# Patient Record
Sex: Male | Born: 1965 | Race: White | Hispanic: No | Marital: Single | Smoking: Current every day smoker
Health system: Southern US, Community
[De-identification: ages and names within clinical notes are randomized; demographics above are authoritative.]

## PROBLEM LIST (undated history)

## (undated) DIAGNOSIS — E785 Hyperlipidemia, unspecified: Secondary | ICD-10-CM

## (undated) DIAGNOSIS — L98499 Non-pressure chronic ulcer of skin of other sites with unspecified severity: Secondary | ICD-10-CM

## (undated) DIAGNOSIS — I10 Essential (primary) hypertension: Secondary | ICD-10-CM

## (undated) HISTORY — PX: TIBIA FRACTURE SURGERY: SHX806

## (undated) HISTORY — DX: Essential (primary) hypertension: I10

## (undated) HISTORY — DX: Non-pressure chronic ulcer of skin of other sites with unspecified severity: L98.499

## (undated) HISTORY — PX: BLADDER SURGERY: SHX569

## (undated) HISTORY — PX: CHOLECYSTECTOMY: SHX55

---

## 2009-04-03 ENCOUNTER — Ambulatory Visit (HOSPITAL_COMMUNITY): Payer: Self-pay | Admitting: Psychiatry

## 2009-04-17 ENCOUNTER — Ambulatory Visit (HOSPITAL_COMMUNITY): Payer: Self-pay | Admitting: Psychiatry

## 2009-05-13 ENCOUNTER — Ambulatory Visit (HOSPITAL_COMMUNITY): Payer: Self-pay | Admitting: Psychiatry

## 2009-05-28 ENCOUNTER — Ambulatory Visit (HOSPITAL_COMMUNITY): Payer: Self-pay | Admitting: Psychiatry

## 2009-06-11 ENCOUNTER — Ambulatory Visit (HOSPITAL_COMMUNITY): Payer: Self-pay | Admitting: Psychiatry

## 2009-06-25 ENCOUNTER — Ambulatory Visit (HOSPITAL_COMMUNITY): Payer: Self-pay | Admitting: Psychiatry

## 2009-07-09 ENCOUNTER — Ambulatory Visit (HOSPITAL_COMMUNITY): Payer: Self-pay | Admitting: Psychiatry

## 2009-07-22 ENCOUNTER — Ambulatory Visit (HOSPITAL_COMMUNITY): Payer: Self-pay | Admitting: Psychiatry

## 2009-08-05 ENCOUNTER — Ambulatory Visit (HOSPITAL_COMMUNITY): Payer: Self-pay | Admitting: Psychiatry

## 2009-09-01 ENCOUNTER — Ambulatory Visit (HOSPITAL_COMMUNITY): Payer: Self-pay | Admitting: Psychiatry

## 2009-09-23 ENCOUNTER — Ambulatory Visit (HOSPITAL_COMMUNITY): Payer: Self-pay | Admitting: Psychiatry

## 2009-10-07 ENCOUNTER — Ambulatory Visit (HOSPITAL_COMMUNITY): Payer: Self-pay | Admitting: Psychiatry

## 2009-10-24 ENCOUNTER — Ambulatory Visit (HOSPITAL_COMMUNITY): Payer: Self-pay | Admitting: Psychiatry

## 2009-11-26 ENCOUNTER — Ambulatory Visit (HOSPITAL_COMMUNITY): Payer: Self-pay | Admitting: Licensed Clinical Social Worker

## 2009-12-17 ENCOUNTER — Ambulatory Visit (HOSPITAL_COMMUNITY): Payer: Self-pay | Admitting: Licensed Clinical Social Worker

## 2010-01-12 ENCOUNTER — Ambulatory Visit (HOSPITAL_COMMUNITY): Payer: Self-pay | Admitting: Licensed Clinical Social Worker

## 2010-01-24 ENCOUNTER — Ambulatory Visit: Payer: Self-pay | Admitting: Family Medicine

## 2010-01-24 DIAGNOSIS — M79609 Pain in unspecified limb: Secondary | ICD-10-CM

## 2010-01-24 DIAGNOSIS — E785 Hyperlipidemia, unspecified: Secondary | ICD-10-CM

## 2010-01-24 DIAGNOSIS — L02419 Cutaneous abscess of limb, unspecified: Secondary | ICD-10-CM

## 2010-01-24 DIAGNOSIS — L03119 Cellulitis of unspecified part of limb: Secondary | ICD-10-CM

## 2010-01-25 ENCOUNTER — Encounter: Payer: Self-pay | Admitting: Family Medicine

## 2010-02-05 ENCOUNTER — Encounter: Payer: Self-pay | Admitting: Family Medicine

## 2010-02-06 ENCOUNTER — Ambulatory Visit: Payer: Self-pay | Admitting: Family Medicine

## 2010-02-06 DIAGNOSIS — M546 Pain in thoracic spine: Secondary | ICD-10-CM

## 2010-02-06 DIAGNOSIS — S139XXA Sprain of joints and ligaments of unspecified parts of neck, initial encounter: Secondary | ICD-10-CM

## 2010-02-09 ENCOUNTER — Encounter: Payer: Self-pay | Admitting: Family Medicine

## 2010-02-12 ENCOUNTER — Ambulatory Visit (HOSPITAL_COMMUNITY): Payer: Self-pay | Admitting: Psychiatry

## 2010-02-18 ENCOUNTER — Ambulatory Visit (HOSPITAL_COMMUNITY): Admission: RE | Admit: 2010-02-18 | Discharge: 2010-02-18 | Payer: Self-pay | Admitting: Sports Medicine

## 2010-03-16 ENCOUNTER — Ambulatory Visit (HOSPITAL_COMMUNITY): Payer: Self-pay | Admitting: Licensed Clinical Social Worker

## 2010-05-14 ENCOUNTER — Ambulatory Visit (HOSPITAL_COMMUNITY): Payer: Self-pay | Admitting: Licensed Clinical Social Worker

## 2010-06-08 ENCOUNTER — Ambulatory Visit (HOSPITAL_COMMUNITY): Payer: Self-pay | Admitting: Licensed Clinical Social Worker

## 2010-07-14 ENCOUNTER — Ambulatory Visit (HOSPITAL_COMMUNITY): Payer: Self-pay | Admitting: Licensed Clinical Social Worker

## 2010-08-12 ENCOUNTER — Ambulatory Visit (HOSPITAL_COMMUNITY): Payer: Self-pay | Admitting: Licensed Clinical Social Worker

## 2010-09-08 ENCOUNTER — Ambulatory Visit (HOSPITAL_COMMUNITY): Payer: Self-pay | Admitting: Licensed Clinical Social Worker
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
## 2010-10-20 NOTE — Letter (Signed)
Summary: Internal Correspondence  Internal Correspondence   Imported By: Dannette Barbara 02/09/2010 09:32:48  _____________________________________________________________________  External Attachment:    Type:   Image     Comment:   External Document
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
## 2010-10-30 ENCOUNTER — Encounter (INDEPENDENT_AMBULATORY_CARE_PROVIDER_SITE_OTHER): Payer: Medicare Other | Admitting: Licensed Clinical Social Worker

## 2010-10-30 DIAGNOSIS — F329 Major depressive disorder, single episode, unspecified: Secondary | ICD-10-CM

## 2010-11-24 ENCOUNTER — Encounter (INDEPENDENT_AMBULATORY_CARE_PROVIDER_SITE_OTHER): Payer: Medicare Other | Admitting: Licensed Clinical Social Worker

## 2010-11-24 DIAGNOSIS — F329 Major depressive disorder, single episode, unspecified: Secondary | ICD-10-CM

## 2010-12-24 ENCOUNTER — Encounter (INDEPENDENT_AMBULATORY_CARE_PROVIDER_SITE_OTHER): Payer: Medicare Other | Admitting: Licensed Clinical Social Worker

## 2010-12-24 DIAGNOSIS — F329 Major depressive disorder, single episode, unspecified: Secondary | ICD-10-CM

## 2011-01-26 ENCOUNTER — Encounter (INDEPENDENT_AMBULATORY_CARE_PROVIDER_SITE_OTHER): Payer: Medicare Other | Admitting: Licensed Clinical Social Worker

## 2011-01-26 DIAGNOSIS — F329 Major depressive disorder, single episode, unspecified: Secondary | ICD-10-CM

## 2011-02-24 ENCOUNTER — Encounter (HOSPITAL_COMMUNITY): Payer: Medicare Other | Admitting: Licensed Clinical Social Worker

## 2011-03-02 ENCOUNTER — Encounter (INDEPENDENT_AMBULATORY_CARE_PROVIDER_SITE_OTHER): Payer: Medicare Other | Admitting: Licensed Clinical Social Worker

## 2011-03-02 DIAGNOSIS — F4323 Adjustment disorder with mixed anxiety and depressed mood: Secondary | ICD-10-CM

## 2011-04-01 ENCOUNTER — Encounter (INDEPENDENT_AMBULATORY_CARE_PROVIDER_SITE_OTHER): Payer: Medicare Other | Admitting: Licensed Clinical Social Worker

## 2011-04-01 DIAGNOSIS — F329 Major depressive disorder, single episode, unspecified: Secondary | ICD-10-CM

## 2011-05-05 ENCOUNTER — Encounter (INDEPENDENT_AMBULATORY_CARE_PROVIDER_SITE_OTHER): Payer: Medicare Other | Admitting: Licensed Clinical Social Worker

## 2011-05-05 DIAGNOSIS — F341 Dysthymic disorder: Secondary | ICD-10-CM

## 2011-07-06 ENCOUNTER — Encounter (INDEPENDENT_AMBULATORY_CARE_PROVIDER_SITE_OTHER): Payer: Medicare Other | Admitting: Licensed Clinical Social Worker

## 2011-07-06 DIAGNOSIS — F4323 Adjustment disorder with mixed anxiety and depressed mood: Secondary | ICD-10-CM

## 2011-08-05 ENCOUNTER — Ambulatory Visit (INDEPENDENT_AMBULATORY_CARE_PROVIDER_SITE_OTHER): Payer: Medicaid Other | Admitting: Licensed Clinical Social Worker

## 2011-08-05 ENCOUNTER — Encounter (HOSPITAL_COMMUNITY): Payer: Self-pay | Admitting: Licensed Clinical Social Worker

## 2011-08-05 DIAGNOSIS — F329 Major depressive disorder, single episode, unspecified: Secondary | ICD-10-CM

## 2011-08-05 NOTE — Patient Instructions (Addendum)
Work on Environmental consultant low - decrease stress to keep BP lower Continue contacts with biological family

## 2011-08-06 ENCOUNTER — Encounter (HOSPITAL_COMMUNITY): Payer: Self-pay | Admitting: Licensed Clinical Social Worker

## 2011-08-06 DIAGNOSIS — F329 Major depressive disorder, single episode, unspecified: Secondary | ICD-10-CM | POA: Insufficient documentation
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
## 2011-10-26 ENCOUNTER — Ambulatory Visit (INDEPENDENT_AMBULATORY_CARE_PROVIDER_SITE_OTHER): Payer: Medicare Other | Admitting: Licensed Clinical Social Worker

## 2011-10-26 DIAGNOSIS — F329 Major depressive disorder, single episode, unspecified: Secondary | ICD-10-CM

## 2011-10-26 NOTE — Patient Instructions (Signed)
Bring complete drug list next time Cautioned about over helping his brother who is in financial trouble

## 2011-10-26 NOTE — Progress Notes (Signed)
Patient ID: Joshua Cantrell, male   DOB: 12/03/1965, 46 y.o.   MRN: [id number]   Joshua Cantrell was in good spirits today - he feels he is doing well and feels good that Eber Jones is responding to his suggestions about her health and use of medications. He has a fair bit of knowledge and is good with her and consistent.  There is concern now for Joshua Cantrell because his brother is in trouble financially which is no surprise - he has taken a loan of the free house and his car and has no way of paying all of it back.  Now he is asking for his brother and sister's help.  He is going to help him go to consumer credit and follow up with him on his agreements.  If he becomes homeless Joshua Cantrell is going to have trouble saying no.  His brother has no idea how to take care of things because he has never been required to take care of himself. His marriage will probably be over once all the money is gone   It probably went to take care of his wife's family.  Joshua Cantrell and his sisters have agreed they are not going to help him financially.  His biological mother is now living in her own apt and family comes to visit her.  She is pleased because she has wanted her own space.  He has been working with Joshua Cantrell and she has been more alert, active and involved.  Even talking about doing some volunteering  She does well with older people and there are so many older people who have been forgotten and would like a visitor.  She is considering that.  Joshua Cantrell has helped her get her stomach and bowels working better She is drinking a lot of water and only 2 cokes a day.  Her blood sugar is tending to go higher once in awhile but she is still on the Metformin and another drug to manage her blood sugar - no insulin yet.  Joshua Cantrell would like to see her come down more on the Xanax but that is probably a losing battle. He has decided to get a cat instead of a dog because Joshua Cantrell will not walk the dog so a cat will be less care taking.  He wants a specific kind of cat that has  its roots in [place] - striped gray. Not a kitten an older car who is more settled.  He studies things on the computer to get information - He seems to enjoy learning and knowledge.

## 2011-12-21 ENCOUNTER — Ambulatory Visit (INDEPENDENT_AMBULATORY_CARE_PROVIDER_SITE_OTHER): Payer: Medicare Other | Admitting: Licensed Clinical Social Worker

## 2011-12-21 DIAGNOSIS — F341 Dysthymic disorder: Secondary | ICD-10-CM
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
## 2012-02-22 ENCOUNTER — Ambulatory Visit (HOSPITAL_COMMUNITY): Payer: Self-pay | Admitting: Licensed Clinical Social Worker

## 2012-03-07 ENCOUNTER — Ambulatory Visit (HOSPITAL_COMMUNITY): Payer: Medicare Other | Admitting: Licensed Clinical Social Worker

## 2012-03-07 NOTE — Progress Notes (Unsigned)
   THERAPIST PROGRESS NOTE  Session Time: 2:10 -3:00  Participation Level: Active  Behavioral Response: CasualAlertEuthymic  Type of Therapy: Individual Therapy  Treatment Goals addressed: Coping  Interventions: Motivational Interviewing and Supportive  Summary: Joshua Cantrell is a 46 y.o. male who presents with Lor    Suicidal/Homicidal: No  Plan: Return again in *** weeks.  Diagnosis: Axis I: Depressive Disorder NOS    Axis II: Deferred    Mcclain Shall,[name], LCSW 03/07/2012

## 2015-02-05 ENCOUNTER — Emergency Department (INDEPENDENT_AMBULATORY_CARE_PROVIDER_SITE_OTHER)
Admission: EM | Admit: 2015-02-05 | Discharge: 2015-02-05 | Disposition: A | Discharge status: Home / Self Care | Payer: Medicare Other | Source: Home / Self Care | Attending: Family Medicine | Admitting: Family Medicine

## 2015-02-05 ENCOUNTER — Encounter: Payer: Self-pay | Admitting: *Deleted

## 2015-02-05 DIAGNOSIS — K047 Periapical abscess without sinus: Secondary | ICD-10-CM

## 2015-02-05 DIAGNOSIS — K088 Other specified disorders of teeth and supporting structures: Secondary | ICD-10-CM | POA: Diagnosis not present

## 2015-02-05 DIAGNOSIS — K0889 Other specified disorders of teeth and supporting structures: Secondary | ICD-10-CM

## 2015-02-05 HISTORY — DX: Hyperlipidemia, unspecified: E78.5

## 2015-02-05 LAB — POCT CBC W AUTO DIFF (K'VILLE URGENT CARE)

## 2015-02-05 MED ORDER — CLINDAMYCIN HCL 300 MG PO CAPS: 300.0000 mg | ORAL_CAPSULE | Freq: Three times a day (TID) | ORAL | Status: DC

## 2015-02-05 MED ORDER — KETOROLAC TROMETHAMINE 60 MG/2ML IM SOLN
60.0000 mg | Freq: Once | INTRAMUSCULAR | Status: AC
  Administered 2015-02-05: 60 mg via INTRAMUSCULAR

## 2015-02-05 MED ORDER — CEFTRIAXONE SODIUM 1 G IJ SOLR
1.0000 g | Freq: Once | INTRAMUSCULAR | Status: AC
  Administered 2015-02-05: 1 g via INTRAMUSCULAR

## 2015-02-05 NOTE — ED Notes (Addendum)
Pt c/o LT front of his mouth tooth abscess x 1 day. He has taken IBF and hydrocodone without relief. Denies fever. Took IBF at 0300.

## 2015-02-05 NOTE — ED Provider Notes (Signed)
CSN: [id number]     Arrival date & time 02/05/15  [id number] History   First MD Initiated Contact with Patient 02/05/15 0914     Chief Complaint  Patient presents with  . Dental Pain      HPI Comments: Patient complains of recurrent left upper toothache yesterday.  This morning he had chills/sweats and left facial pain.  Two months ago he had a similar toothache and was treated with an antibiotic by his dentist, who recommended that he return for a root canal (patient forgot about rescheduling an appointment)  Patient is a 49 y.o. male presenting with tooth pain. The history is provided by the patient.  Dental Pain Location:  Upper Upper teeth location:  11/LU cuspid Quality:  Dull Onset quality:  Gradual Duration:  2 days Timing:  Constant Progression:  Worsening Chronicity:  Recurrent Context: poor dentition   Previous work-up:  Dental exam Relieved by:  Nothing Worsened by:  Hot food/drink and touching Ineffective treatments:  None tried Associated symptoms: facial pain, fever and gum swelling   Associated symptoms: no congestion, no difficulty swallowing, no drooling, no neck pain, no neck swelling, no oral bleeding, no oral lesions and no trismus     Past Medical History  Diagnosis Date  . Hypertension   . Ulcer of abdomen wall   . Hyperlipidemia    Past Surgical History  Procedure Laterality Date  . Tibia fracture surgery    . Bladder surgery     Family History  Problem Relation Age of Onset  . Adopted: Yes  . Hyperlipidemia Mother   . Hypertension Mother   . Hyperlipidemia Father   . Hypertension Father    History  Substance Use Topics  . Smoking status: Former Smoker -- 0.25 packs/day for 2 years    Types: Cigarettes  . Smokeless tobacco: Never Used  . Alcohol Use: Yes     Comment: occasionally    Review of Systems  Constitutional: Positive for fever.  HENT: Negative for congestion, drooling and mouth sores.   Musculoskeletal: Negative for neck pain.  All  other systems reviewed and are negative.   Allergies  Morphine  Home Medications   Prior to Admission medications   Medication Sig Start Date End Date Taking? Authorizing Provider  HYDROcodone-acetaminophen (NORCO/VICODIN) 5-325 MG per tablet Take 1 tablet by mouth every 6 (six) hours as needed for moderate pain.   Yes Historical Provider, MD  UNKNOWN TO PATIENT    Yes Historical Provider, MD  UNKNOWN TO PATIENT    Yes Historical Provider, MD  amLODipine-benazepril (LOTREL) 10-20 MG per capsule Take 1 capsule by mouth daily.    Historical Provider, MD  clindamycin (CLEOCIN) 300 MG capsule Take 1 capsule (300 mg total) by mouth 3 (three) times daily. 02/05/15   Lattie HawStephen A Beese, MD  diazepam (VALIUM) 10 MG tablet Take 5 mg by mouth 1 day or 1 dose.    Historical Provider, MD  diphenhydrAMINE (SOMINEX) 25 MG tablet Take 25 mg by mouth at bedtime as needed.    Historical Provider, MD  oxymetazoline (AFRIN) 0.05 % nasal spray Place 2 sprays into the nose 2 (two) times daily.    Historical Provider, MD   BP 146/101 mmHg  Pulse 105  Temp(Src) 98.4 F (36.9 C) (Oral)  Resp 18  Ht 5\' 9"  (1.753 m)  Wt 179 lb (81.194 kg)  BMI 26.42 kg/m2  SpO2 98% Physical Exam  Constitutional: He is oriented to person, place, and time. He appears well-developed  and well-nourished. No distress.  HENT:  Head: Normocephalic.    Right Ear: External ear normal.  Left Ear: External ear normal.  Nose: Nose normal.  Mouth/Throat: Oropharynx is clear and moist and mucous membranes are normal. No trismus in the jaw. Abnormal dentition. Dental caries present. No uvula swelling.    There is tenderness to palpation over left face, without swelling, as noted on diagram.  There is tenderness to tap on left upper tooth as noted on diagram.  No gingival swelling.  There is tenderness to palpation over left submandibular node as noted on diagram.        Eyes: Conjunctivae are normal. Pupils are equal, round, and  reactive to light.  Neck: Neck supple.  Cardiovascular: Normal heart sounds.   Pulmonary/Chest: Breath sounds normal.  Abdominal: There is no tenderness.  Lymphadenopathy:    He has no cervical adenopathy.  Neurological: He is alert and oriented to person, place, and time.  Skin: Skin is warm and dry. No rash noted.  Nursing note and vitals reviewed.   ED Course  Procedures  None    Labs Reviewed  POCT CBC W AUTO DIFF ([facility]):  WBC 15.6; LY 16.4; MO 1.6; GR 82.0; Hgb 16.1; Platelets 357          MDM   1. Pain, dental   2. Periapical abscess without sinus; note leukocytosis 15.6    Rocephin 1gm IM.  Toradol 60mg  IM.  Begin Clindamycin 300mg  Q8hr. Take Ibuprofen 200mg , 4 tabs every 8 hours with food.  Recommend taking a probiotic (such as yogurt with live culture). If symptoms become significantly worse during the night or over the weekend, proceed to the local emergency room.  Followup with dentist within 48 hours.    Lattie HawStephen A Beese, MD 02/09/15 [phone]

## 2015-02-05 NOTE — Discharge Instructions (Signed)
Take Ibuprofen 200mg , 4 tabs every 8 hours with food.  Recommend taking a probiotic (such as yogurt with live culture). If symptoms become significantly worse during the night or over the weekend, proceed to the local emergency room.    Dental Abscess A dental abscess is a collection of infected fluid (pus) from a bacterial infection in the inner part of the tooth (pulp). It usually occurs at the end of the tooth's root.  CAUSES   Severe tooth decay.  Trauma to the tooth that allows bacteria to enter into the pulp, such as a broken or chipped tooth. SYMPTOMS   Severe pain in and around the infected tooth.  Swelling and redness around the abscessed tooth or in the mouth or face.  Tenderness.  Pus drainage.  Bad breath.  Bitter taste in the mouth.  Difficulty swallowing.  Difficulty opening the mouth.  Nausea.  Vomiting.  Chills.  Swollen neck glands. DIAGNOSIS   A medical and dental history will be taken.  An examination will be performed by tapping on the abscessed tooth.  X-rays may be taken of the tooth to identify the abscess. TREATMENT The goal of treatment is to eliminate the infection. You may be prescribed antibiotic medicine to stop the infection from spreading. A root canal may be performed to save the tooth. If the tooth cannot be saved, it may be pulled (extracted) and the abscess may be drained.  HOME CARE INSTRUCTIONS  Only take over-the-counter or prescription medicines for pain, fever, or discomfort as directed by your caregiver.  Rinse your mouth (gargle) often with salt water ( tsp salt in 8 oz [250 ml] of warm water) to relieve pain or swelling.  Do not drive after taking pain medicine (narcotics).  Do not apply heat to the outside of your face.  Return to your dentist for further treatment as directed. SEEK MEDICAL CARE IF:  Your pain is not helped by medicine.  Your pain is getting worse instead of better. SEEK IMMEDIATE MEDICAL CARE  IF:  You have a fever or persistent symptoms for more than 2-3 days.  You have a fever and your symptoms suddenly get worse.  You have chills or a very bad headache.  You have problems breathing or swallowing.  You have trouble opening your mouth.  You have swelling in the neck or around the eye. Document Released: 09/06/2005 Document Revised: 05/31/2012 Document Reviewed: 12/15/2010 [facility] Patient Information 2015 [place]. This information is not intended to replace advice given to you by your health care provider. Make sure you discuss any questions you have with your health care provider.

## 2015-02-09 ENCOUNTER — Telehealth: Payer: Self-pay | Admitting: Emergency Medicine

## 2015-02-18 ENCOUNTER — Emergency Department (INDEPENDENT_AMBULATORY_CARE_PROVIDER_SITE_OTHER)
Admission: EM | Admit: 2015-02-18 | Discharge: 2015-02-18 | Disposition: A | Discharge status: Home / Self Care | Payer: Medicare Other | Source: Home / Self Care | Attending: Emergency Medicine | Admitting: Emergency Medicine

## 2015-02-18 ENCOUNTER — Encounter: Payer: Self-pay | Admitting: *Deleted

## 2015-02-18 DIAGNOSIS — M79604 Pain in right leg: Secondary | ICD-10-CM

## 2015-02-18 DIAGNOSIS — M79606 Pain in leg, unspecified: Secondary | ICD-10-CM

## 2015-02-18 NOTE — Discharge Instructions (Signed)
[facility]

## 2015-02-18 NOTE — ED Provider Notes (Signed)
CSN: [id number]     Arrival date & time 02/18/15  1128 History   First MD Initiated Contact with Patient 02/18/15 1213     Chief Complaint  Patient presents with  . Leg Pain   (Consider location/radiation/quality/duration/timing/severity/associated sxs/prior Treatment) Patient is a 49 y.o. male presenting with leg pain. The history is provided by the patient. No language interpreter was used.  Leg Pain Location:  Leg Time since incident:  3 days Injury: no   Pain details:    Quality:  Aching   Radiates to:  Does not radiate   Severity:  Moderate   Onset quality:  Gradual   Duration:  3 days   Timing:  Constant   Progression:  Worsening Chronicity:  New Dislocation: no   Relieved by:  Nothing Worsened by:  Nothing tried Ineffective treatments:  None tried Pt was recently hospitalized.  He had one shot of lovenox to prevent blood clot.   Pt complains of pain in his calf up his leg.   Pt concerned that pain is vein.   Past Medical History  Diagnosis Date  . Hypertension   . Ulcer of abdomen wall   . Hyperlipidemia    Past Surgical History  Procedure Laterality Date  . Tibia fracture surgery    . Bladder surgery     Family History  Problem Relation Age of Onset  . Adopted: Yes  . Hyperlipidemia Mother   . Hypertension Mother   . Hyperlipidemia Father   . Hypertension Father    History  Substance Use Topics  . Smoking status: Former Smoker -- 0.25 packs/day for 2 years    Types: Cigarettes  . Smokeless tobacco: Never Used  . Alcohol Use: Yes     Comment: occasionally    Review of Systems  All other systems reviewed and are negative.   Allergies  Morphine  Home Medications   Prior to Admission medications   Medication Sig Start Date End Date Taking? Authorizing Provider  amLODipine-benazepril (LOTREL) 10-20 MG per capsule Take 1 capsule by mouth daily.    Historical Provider, MD  clindamycin (CLEOCIN) 300 MG capsule Take 1 capsule (300 mg total) by mouth  3 (three) times daily. 02/05/15   Lattie Haw, MD  diazepam (VALIUM) 10 MG tablet Take 5 mg by mouth 1 day or 1 dose.    Historical Provider, MD  diphenhydrAMINE (SOMINEX) 25 MG tablet Take 25 mg by mouth at bedtime as needed.    Historical Provider, MD  HYDROcodone-acetaminophen (NORCO/VICODIN) 5-325 MG per tablet Take 1 tablet by mouth every 6 (six) hours as needed for moderate pain.    Historical Provider, MD  oxymetazoline (AFRIN) 0.05 % nasal spray Place 2 sprays into the nose 2 (two) times daily.    Historical Provider, MD  UNKNOWN TO PATIENT     Historical Provider, MD  UNKNOWN TO PATIENT     Historical Provider, MD   BP 115/83 mmHg  Pulse 100  Temp(Src) 97.7 F (36.5 C) (Oral)  Resp 16  SpO2 97% Physical Exam  Constitutional: He is oriented to person, place, and time. He appears well-developed and well-nourished.  HENT:  Head: Normocephalic.  Cardiovascular: Normal rate.   Pulmonary/Chest: Effort normal.  Musculoskeletal: He exhibits tenderness.  Tender calf and posterior leg,  No obvious swelling.  Neurological: He is alert and oriented to person, place, and time. He has normal reflexes.  Skin: Skin is warm.  Psychiatric: He has a normal mood and affect.  ED Course  Procedures (including critical care time) Labs Review Labs Reviewed - No data to display  Imaging Review No results found.   MDM   1. Leg pain, right     Pt needs doppler.  [facility]     Elson AreasLeslie K Mayia Megill, PA-C 02/18/15 [address]1322  Saina Waage K Halfway HouseSofia, [place] JerseyPA-C 02/18/15 1323

## 2015-02-18 NOTE — ED Notes (Signed)
Pt c/o RT leg pain x 3 days. Denies injury. He took Loricet, IBF, Lidoderm patch, and Flexeril without relief.

## 2016-02-17 ENCOUNTER — Encounter: Payer: Self-pay | Admitting: *Deleted

## 2016-02-17 ENCOUNTER — Emergency Department (INDEPENDENT_AMBULATORY_CARE_PROVIDER_SITE_OTHER)
Admission: EM | Admit: 2016-02-17 | Discharge: 2016-02-17 | Disposition: A | Discharge status: Home / Self Care | Payer: Medicare Other | Source: Home / Self Care | Attending: Family Medicine | Admitting: Family Medicine

## 2016-02-17 DIAGNOSIS — R51 Headache: Secondary | ICD-10-CM | POA: Diagnosis not present

## 2016-02-17 DIAGNOSIS — R519 Headache, unspecified: Secondary | ICD-10-CM

## 2016-02-17 DIAGNOSIS — J019 Acute sinusitis, unspecified: Secondary | ICD-10-CM

## 2016-02-17 DIAGNOSIS — F1910 Other psychoactive substance abuse, uncomplicated: Secondary | ICD-10-CM

## 2016-02-17 DIAGNOSIS — R251 Tremor, unspecified: Secondary | ICD-10-CM

## 2016-02-17 DIAGNOSIS — F1990 Other psychoactive substance use, unspecified, uncomplicated: Secondary | ICD-10-CM

## 2016-02-17 MED ORDER — DEXAMETHASONE SODIUM PHOSPHATE 10 MG/ML IJ SOLN
10.0000 mg | Freq: Once | INTRAMUSCULAR | Status: AC
  Administered 2016-02-17: 10 mg via INTRAMUSCULAR

## 2016-02-17 MED ORDER — AMOXICILLIN-POT CLAVULANATE 875-125 MG PO TABS: 1.0000 | ORAL_TABLET | Freq: Two times a day (BID) | ORAL | Status: DC

## 2016-02-17 MED ORDER — PREDNISONE 20 MG PO TABS: ORAL_TABLET | ORAL | Status: DC

## 2016-02-17 MED ORDER — ACETAMINOPHEN 325 MG PO TABS
650.0000 mg | ORAL_TABLET | Freq: Once | ORAL | Status: AC
  Administered 2016-02-17: 650 mg via ORAL

## 2016-02-17 MED ORDER — FLUTICASONE PROPIONATE 50 MCG/ACT NA SUSP: 2.0000 | Freq: Every day | NASAL | Status: DC

## 2016-02-17 NOTE — ED Notes (Signed)
Pt c/o 1 week of nasal congestion, HA and sinus pain. He has taken 36 Benadryl and 36 Ibuprofen in the past 12 hours. He also c/o tremors and blackouts. He was on Valium in the past but was taken off by his physician.

## 2016-02-17 NOTE — ED Provider Notes (Signed)
CSN: [id number]     Arrival date & time 02/17/16  1441 History   First MD Initiated Contact with Patient 02/17/16 1505     Chief Complaint  Patient presents with  . Facial Pain  . Headache  . Tremors   (Consider location/radiation/quality/duration/timing/severity/associated sxs/prior Treatment) HPI The pt is a 50yo male presenting to [facility], accompanied by wife, with c/o 1 week of worsening nasal congestion with generalized headache and sinus pain.  He also reports tremors and "blackouts" last night but then notes he was up until 1AM and states it was "very late"  He does have a hx of seasonal allergies and has been using OTC Afrin daily as well as took "36 benadryl and 36 ibuprofen" in the last 12 hours to help with his congestion. Denies trying to hurt himself.  He has a hx of migraines but has not seen a neurologist in several years as his headaches did seem to improve.  Current headache was gradual in onset and waxes and wanes throughout the day.  He has not tried acetaminophen for his headache yet but is requesting a steroid shot as he has had them in the past and state they help.  He also notes he has a tremor and believes it is from being off valium but notes his PCP took him of that medications a few years ago, "trying to wean me off narcotic medications."  Per pt, his PCP is on vacation for 2 weeks without a backup provider.  Denies change in vision, n/v/d. Denies known fevers but did have hot and cold chills last night.   Past Medical History  Diagnosis Date  . Hypertension   . Ulcer of abdomen wall (HCC)   . Hyperlipidemia    Past Surgical History  Procedure Laterality Date  . Tibia fracture surgery    . Bladder surgery     Family History  Problem Relation Age of Onset  . Adopted: Yes  . Hyperlipidemia Mother   . Hypertension Mother   . Hyperlipidemia Father   . Hypertension Father    Social History  Substance Use Topics  . Smoking status: Former Smoker -- 0.25 packs/day for  2 years    Types: Cigarettes  . Smokeless tobacco: Never Used  . Alcohol Use: Yes     Comment: occasionally    Review of Systems  Constitutional: Negative for fever and chills.  HENT: Positive for congestion, rhinorrhea, sinus pressure, sneezing and sore throat. Negative for ear pain, trouble swallowing and voice change.   Respiratory: Positive for cough. Negative for shortness of breath.   Cardiovascular: Negative for chest pain and palpitations.  Gastrointestinal: Negative for nausea, vomiting, abdominal pain and diarrhea.  Musculoskeletal: Negative for myalgias, back pain and arthralgias.  Skin: Negative for rash.  Neurological: Positive for tremors and headaches. Negative for dizziness and light-headedness.    Allergies  Morphine  Home Medications   Prior to Admission medications   Medication Sig Start Date End Date Taking? Authorizing Provider  amLODipine-benazepril (LOTREL) 10-20 MG per capsule Take 1 capsule by mouth daily.    Historical Provider, MD  amoxicillin-clavulanate (AUGMENTIN) 875-125 MG tablet Take 1 tablet by mouth 2 (two) times daily. One po bid x 7 days 02/17/16   Junius Finner, PA-C  diazepam (VALIUM) 10 MG tablet Take 5 mg by mouth 1 day or 1 dose.    Historical Provider, MD  diphenhydrAMINE (SOMINEX) 25 MG tablet Take 25 mg by mouth at bedtime as needed.    Historical  Provider, MD  fluticasone (FLONASE) 50 MCG/ACT nasal spray Place 2 sprays into both nostrils daily. 02/17/16   Junius Finner, PA-C  HYDROcodone-acetaminophen (NORCO/VICODIN) 5-325 MG per tablet Take 1 tablet by mouth every 6 (six) hours as needed for moderate pain.    Historical Provider, MD  oxymetazoline (AFRIN) 0.05 % nasal spray Place 2 sprays into the nose 2 (two) times daily.    Historical Provider, MD  predniSONE (DELTASONE) 20 MG tablet 3 tabs po day one, then 2 po daily x 4 days 02/17/16   Junius Finner, PA-C   Meds Ordered and Administered this Visit   Medications  acetaminophen  (TYLENOL) tablet 650 mg (650 mg Oral Given 02/17/16 1528)  dexamethasone (DECADRON) injection 10 mg (10 mg Intramuscular Given 02/17/16 1536)    BP 143/81 mmHg  Pulse 92  Temp(Src) 98.2 F (36.8 C) (Oral)  Wt 173 lb (78.472 kg)  SpO2 98% No data found.   Physical Exam  Constitutional: He is oriented to person, place, and time. He appears well-developed and well-nourished.  HENT:  Head: Normocephalic and atraumatic.  Right Ear: Tympanic membrane normal.  Left Ear: Tympanic membrane normal.  Nose: Mucosal edema present. Right sinus exhibits maxillary sinus tenderness and frontal sinus tenderness. Left sinus exhibits maxillary sinus tenderness and frontal sinus tenderness.  Mouth/Throat: Uvula is midline, oropharynx is clear and moist and mucous membranes are normal.  Eyes: Conjunctivae and EOM are normal. Pupils are equal, round, and reactive to light. No scleral icterus.  Neck: Normal range of motion. Neck supple.  Cardiovascular: Normal rate, regular rhythm and normal heart sounds.   Pulmonary/Chest: Effort normal and breath sounds normal. No respiratory distress. He has no wheezes. He has no rales. He exhibits no tenderness.  Abdominal: Soft. He exhibits no distension. There is no tenderness.  Musculoskeletal: Normal range of motion.  Neurological: He is alert and oriented to person, place, and time. He has normal strength. No cranial nerve deficit or sensory deficit. Gait abnormal. Coordination normal. GCS eye subscore is 4. GCS verbal subscore is 5. GCS motor subscore is 6.  Speech is clear and goal oriented. Alert to person, place and time. Antalgic gait, walks with a cane (for chronic back pain), essential tremor.  Tremor comes and goes when engaged in conversation.   Skin: Skin is warm and dry.  Nursing note and vitals reviewed.   ED Course  Procedures (including critical care time)  Labs Review Labs Reviewed - No data to display  Imaging Review No results  found.    MDM   1. Acute rhinosinusitis   2. Sinus headache   3. Tremor   4. Misuse of over-the-counter medications    Pt c/o headache, sinus congestion and pressure, and tremor.  Hx of migraines and chronic pain.   Pt [place] Controlled Substance Database, pt had 30 day supply of Vicodin dispensed on 02/11/16. Pt states this is for his chronic back pain, he has not tried taking in last 24 hours for his headache.  Advised pt Valium, Xanax, or any other controlled substance would not be prescribed today.    Filed Vitals:   02/17/16 1526  BP: 143/81  Pulse: 92  Temp: 98.2 F (36.8 C)  O2 Sat- 98% on RA. No evidence of respiratory distress.   Pt alert to person, place and time. Speech is clear. Tremor resolves when pt engaged in conversation. Question whether pt actually took said amount of benadryl and ibuprofen, however, strongly discouraged taking more medication- prescription or OTC,  than directed. Denies SI/HI.  Will treat for sinus infection. Doubt CVA, SAH, or other emergent process taking place at this time.   Tx in UC: acetaminophen and decadron 10mg  IM  Rx: Augmentin, Prednisone, and Flonase.   Advised to discontinue use of Afrin and Benadryl.  F/u with PCP in 1 week, sooner if worsening. Resources for new provider given as pt's current PCP has reportedly left for 2 weeks w/o replacement.   Discussed symptoms that warrant emergent care in the ED. Wife and pt verbalized understanding and agreement with tx plan.   Junius FinnerErin O'Malley, PA-C 02/17/16 1625

## 2016-02-17 NOTE — Discharge Instructions (Signed)
Please take antibiotics as prescribed and be sure to complete entire course even if you start to feel better to ensure infection does not come back.  It is very important to take all medications, including over the counter medications, only as directed by the packaging or instructions from your medical provider.   Taking too much benadryl can cause drowsiness, difficulty breathing or even death.    Taking too much ibuprofen can cause or worsen stomach ulcers and also cause "rebound headaches" (see below for more information).    Do not use Afrin for longer than 3 days in a row as it can actually make nasal congestion worse.  It is safe to use plain nasal saline throughout the day to help keep mucous thin and nasal passages moist.  It is also safe to use the prescribed Flonase daily but do not use more than 2 sprays per nostril daily.   Sinus rinses including the Neti Pot or Delmar Med nasal irrigation can help with nasal congestion and sinus pressure.    Please schedule a follow up exam with your primary care provider or a new primary care provider that does not take off 2 weeks at a time without replacement providers, for ongoing healthcare needs including further evaluation of recurrent headaches as you may need to be referred back to a neurologist for migraines.    Analgesic Rebound Headaches An analgesic rebound headache is a headache that returns after pain medicine (analgesic) that was taken to treat the initial headache wears off. People who suffer from tension, migraine, or cluster headaches are at risk for developing rebound headaches. Any type of primary headache can return as a rebound headache if you regularly take analgesics more than three times a week. If the cycle of rebound headaches continues, they become chronic daily headaches.  CAUSES Analgesics frequently associated with this problem include common over-the-counter medicines like aspirin, ibuprofen, acetaminophen, sinus relief  medicines, and other medicines that contain caffeine. Narcotic pain medicines are also a common cause of rebound headaches.  SIGNS AND SYMPTOMS The symptoms of rebound headaches are the same as the symptoms of your initial headache. Symptoms of specific types of headaches include: Tension headache  Pressure around the head.  Dull, aching head pain.  Pain felt over the front and sides of the head.  Tenderness in the muscles of the head, neck and shoulders. Migraine Headache  Pulsing or throbbing pain on one or both sides of the head.  Severe pain that interferes with daily activities.  Pain that is worsened by physical activity.  Nausea, vomiting, or both.  Pain with exposure to bright light, loud noises, or strong smells.  General sensitivity to bright light, loud noises, or strong smells.  Visual changes.  Numbness of one or both arms. Cluster Headaches  Severe pain that begins in or around one eye or temple.  Redness in the eye on the same side as the pain.  Droopy or swollen eyelid.  One-sided head pain.  Nausea.  Runny nose.  Sweaty, pale facial skin.  Restlessness. DIAGNOSIS  Analgesic rebound headaches are diagnosed by reviewing your medical history. This includes the nature of your initial headaches, as well as the type of pain medicines you have been using to treat your headaches and how often you take them. TREATMENT Discontinuing frequent use of the analgesic medicine will typically reduce the frequency of the rebound episodes. This may initially worsen your headaches but eventually the pain should become more manageable, less frequent, and  less severe.  Seeing a headache specialists may helpful. He or she may be able to help you manage your headaches and to make sure there is not another cause of the headaches. Alternative methods of stress relief such as acupuncture, counseling, biofeedback, and massage may also be helpful. Talk with your health care  provider about which alternative treatments might be good for you. HOME CARE INSTRUCTIONS Stopping the regular use of pain medicine can be difficult. Follow your health care provider's instructions carefully. Keep all of your appointments. Avoid triggers that are known to cause your primary headaches. SEEK MEDICAL CARE IF: You continue to experience headaches after following your health care provider's recommended treatments. SEEK IMMEDIATE MEDICAL CARE IF:  You develop new headache pain.  You develop headache pain that is different than what you have experienced in the past.  You develop numbness or tingling in your arms or legs.  You develop changes in your speech or vision. MAKE SURE YOU:  Understand these instructions.  Will watch your child's condition.  Will get help right away if your child is not doing well or gets worse.   This information is not intended to replace advice given to you by your health care provider. Make sure you discuss any questions you have with your health care provider.   Document Released: 11/27/2003 Document Revised: 09/27/2014 Document Reviewed: 03/22/2013 [employer] Interactive Patient Education [employer]

## 2016-02-20 ENCOUNTER — Telehealth: Payer: Self-pay

## 2016-02-20 NOTE — ED Notes (Signed)
Pt stated that he is feeling better.  Encouraged to call PCP or UC if questions or problems.

## 2019-04-18 ENCOUNTER — Encounter: Payer: Self-pay | Admitting: *Deleted

## 2019-04-18 ENCOUNTER — Emergency Department
Admission: EM | Admit: 2019-04-18 | Discharge: 2019-04-18 | Disposition: A | Discharge status: Home / Self Care | Payer: Medicare Other | Source: Home / Self Care | Attending: Family Medicine | Admitting: Family Medicine

## 2019-04-18 ENCOUNTER — Other Ambulatory Visit: Payer: Self-pay

## 2019-04-18 DIAGNOSIS — R509 Fever, unspecified: Secondary | ICD-10-CM

## 2019-04-18 DIAGNOSIS — J069 Acute upper respiratory infection, unspecified: Secondary | ICD-10-CM

## 2019-04-18 MED ORDER — PREDNISONE 20 MG PO TABS: ORAL_TABLET | ORAL | 0 refills | Status: DC

## 2019-04-18 MED ORDER — AMOXICILLIN 875 MG PO TABS: 875.0000 mg | ORAL_TABLET | Freq: Two times a day (BID) | ORAL | 0 refills | Status: DC

## 2019-04-18 NOTE — Discharge Instructions (Addendum)
Take plain guaifenesin (1200mg  extended release tabs such as Mucinex) twice daily, with plenty of water, for cough and congestion.  May add Pseudoephedrine (30mg , one or two every 4 to 6 hours) for sinus congestion.  Get adequate rest.   May use Afrin nasal spray (or generic oxymetazoline) each morning for about 5 days and then discontinue.  Also recommend using saline nasal spray several times daily and saline nasal irrigation (AYR is a common brand).  Use Flonase nasal spray each morning after using Afrin nasal spray and saline nasal irrigation. Try warm salt water gargles for sore throat.  Stop all antihistamines (cetirizine, etc) for now, and other non-prescription cough/cold preparations. May take Delsym Cough Suppressant at bedtime for nighttime cough.    If symptoms become significantly worse during the night or over the weekend, proceed to the local emergency room.

## 2019-04-18 NOTE — ED Provider Notes (Signed)
[facility]    CSN: [id number] Arrival date & time: 04/18/19  1247     History   Chief Complaint Chief Complaint  Patient presents with  . Headache  . Nasal Congestion    HPI Joshua Cantrell is a 53 y.o. male.   Patient complains of three day history of typical cold-like symptoms developing over several days, including mild sore throat, sinus congestion, headache, fatigue, low grade fever and occasional cough.  He denies chest pain/tightness, shortness of breath, and pleuritic pain.  He denies changes in smell/taste. He has a history of seasonal rhinitis.  The history is provided by the patient.    Past Medical History:  Diagnosis Date  . Hyperlipidemia   . Hypertension   . Ulcer of abdomen wall [facility](HCC)     Patient Active Problem List   Diagnosis Date Noted  . Depression 08/06/2011    Class: Diagnosis of  . BACK PAIN, THORACIC REGION, RIGHT 02/06/2010  . CERVICAL MUSCLE STRAIN 02/06/2010  . HYPERLIPIDEMIA 01/24/2010  . CELLULITIS, LEG, LEFT 01/24/2010  . LEG PAIN, LEFT 01/24/2010    Past Surgical History:  Procedure Laterality Date  . BLADDER SURGERY    . TIBIA FRACTURE SURGERY         Home Medications    Prior to Admission medications   Medication Sig Start Date End Date Taking? Authorizing Provider  cetirizine (ZYRTEC) 10 MG tablet TAKE 1 TABLET(10 MG) BY MOUTH DAILY 12/27/18  Yes [provider]  diltiazem (CARDIZEM CD) 240 MG 24 hr capsule TAKE 1 CAPSULE(240 MG) BY MOUTH DAILY 12/27/18  Yes [provider]  meloxicam (MOBIC) 7.5 MG tablet TAKE 1 TABLET(7.5 MG) BY MOUTH DAILY 04/02/19  Yes [provider]  omeprazole (PRILOSEC) 20 MG capsule Take by mouth. 12/27/18 12/27/19 Yes [provider]  amoxicillin (AMOXIL) 875 MG tablet Take 1 tablet (875 mg total) by mouth 2 (two) times daily. 04/18/19   Lattie HawBeese, Laney Bagshaw A, MD  atorvastatin (LIPITOR) 20 MG tablet  04/01/19   [provider]  predniSONE (DELTASONE) 20  MG tablet Take one tab by mouth twice daily for 4 days, then one daily. Take with food. 04/18/19   Lattie HawBeese, Loisann Roach A, MD    Family History Family History  Adopted: Yes  Problem Relation Age of Onset  . Hyperlipidemia Mother   . Hypertension Mother   . Hyperlipidemia Father   . Hypertension Father     Social History Social History   Tobacco Use  . Smoking status: Former Smoker    Packs/day: 0.25    Years: 2.00    Pack years: 0.50    Types: Cigarettes  . Smokeless tobacco: Never Used  Substance Use Topics  . Alcohol use: Yes    Comment: occasionally  . Drug use: No     Allergies   Morphine   Review of Systems Review of Systems No sore throat + occasional cough No pleuritic pain No wheezing + nasal congestion + post-nasal drainage + sinus pain/pressure No itchy/red eyes No earache No hemoptysis + mild SOB + low grade fever, + chills + nausea, resolved + vomiting, resolved No abdominal pain No diarrhea No urinary symptoms No skin rash + fatigue No myalgias + headache Used OTC meds without relief   Physical Exam Triage Vital Signs ED Triage Vitals  Enc Vitals Group     BP 04/18/19 1326 133/84     Pulse Rate 04/18/19 1326 71     Resp 04/18/19 1326 18  Temp 04/18/19 1326 98.7 F (37.1 C)     Temp Source 04/18/19 1326 Oral     SpO2 04/18/19 1326 97 %     Weight 04/18/19 1327 178 lb (80.7 kg)     Height 04/18/19 1327 5\' 9"  (1.753 m)     Head Circumference --      Peak Flow --      Pain Score 04/18/19 1327 0     Pain Loc --      Pain Edu? --      Excl. in [place]? --    No data found.  Updated Vital Signs BP 133/84 (BP Location: Right Arm)   Pulse 71   Temp 98.7 F (37.1 C) (Oral)   Resp 18   Ht 5\' 9"  (1.753 m)   Wt 80.7 kg   SpO2 97%   BMI 26.29 kg/m   Visual Acuity Right Eye Distance:   Left Eye Distance:   Bilateral Distance:    Right Eye Near:   Left Eye Near:    Bilateral Near:     Physical Exam Nursing notes and Vital  Signs reviewed. Appearance:  Patient appears stated age, and in no acute distress Eyes:  Pupils are equal, round, and reactive to light and accomodation.  Extraocular movement is intact.  Conjunctivae are not inflamed  Ears:  Canals normal.  Tympanic membranes normal.  Nose:  Congested turbinates.  Maxillary sinus tenderness is present.  Pharynx:  Normal Neck:  Supple.  Enlarged posterior/lateral nodes are palpated bilaterally, tender to palpation on the left.   Lungs:  Clear to auscultation.  Breath sounds are equal.  Moving air well. Heart:  Regular rate and rhythm without murmurs, rubs, or gallops.  Abdomen:  Nontender without masses or hepatosplenomegaly.  Bowel sounds are present.  No CVA or flank tenderness.  Extremities:  No edema.  Skin:  No rash present.    UC Treatments / Results  Labs (all labs ordered are listed, but only abnormal results are displayed) Labs Reviewed  NOVEL CORONAVIRUS, NAA    EKG   Radiology No results found.  Procedures Procedures (including critical care time)  Medications Ordered in UC Medications - No data to display  Initial Impression / Assessment and Plan / UC Course  I have reviewed the triage vital signs and the nursing notes.  Pertinent labs & imaging results that were available during my care of the patient were reviewed by me and considered in my medical decision making (see chart for details).    Doubt corona virus, but will check COVID19. Because of his history of perennial rhinitis and frequent sinusitis, will begin amoxicillin and prednisone burst/taper. Followup with Family Doctor if not improved in 10 days.   Final Clinical Impressions(s) / UC Diagnoses   Final diagnoses:  Fever, unspecified  Viral URI     Discharge Instructions     Take plain guaifenesin (1200mg  extended release tabs such as Mucinex) twice daily, with plenty of water, for cough and congestion.  May add Pseudoephedrine (30mg , one or two every 4 to 6  hours) for sinus congestion.  Get adequate rest.   May use Afrin nasal spray (or generic oxymetazoline) each morning for about 5 days and then discontinue.  Also recommend using saline nasal spray several times daily and saline nasal irrigation (AYR is a common brand).  Use Flonase nasal spray each morning after using Afrin nasal spray and saline nasal irrigation. Try warm salt water gargles for sore throat.  Stop all  antihistamines (cetirizine, etc) for now, and other non-prescription cough/cold preparations. May take Delsym Cough Suppressant at bedtime for nighttime cough.    If symptoms become significantly worse during the night or over the weekend, proceed to the local emergency room.     ED Prescriptions    Medication Sig Dispense Auth. Provider   amoxicillin (AMOXIL) 875 MG tablet Take 1 tablet (875 mg total) by mouth 2 (two) times daily. 20 tablet Lattie HawBeese, Robley Matassa A, MD   predniSONE (DELTASONE) 20 MG tablet Take one tab by mouth twice daily for 4 days, then one daily. Take with food. 12 tablet Lattie HawBeese, Rilya Longo A, MD        Lattie HawBeese, Ernest Orr A, MD 04/19/19 [id number]

## 2019-04-18 NOTE — ED Triage Notes (Signed)
Pt c/o nasal congestion, HA, dizziness, fatigue, SOB and fever (101) x 2 days. Last dose IBF at 1200 today. He is using Afrin regularly.

## 2019-04-20 ENCOUNTER — Telehealth: Payer: Self-pay | Admitting: Emergency Medicine

## 2019-04-20 LAB — NOVEL CORONAVIRUS, NAA: SARS-CoV-2, NAA: NOT DETECTED

## 2019-04-20 NOTE — Telephone Encounter (Signed)
COVID neg, LM

## 2020-03-25 ENCOUNTER — Emergency Department
Admission: EM | Admit: 2020-03-25 | Discharge: 2020-03-25 | Disposition: A | Discharge status: Home / Self Care | Payer: Medicare Other | Source: Home / Self Care | Attending: Family Medicine | Admitting: Family Medicine

## 2020-03-25 ENCOUNTER — Other Ambulatory Visit: Payer: Self-pay

## 2020-03-25 ENCOUNTER — Encounter: Payer: Self-pay | Admitting: Emergency Medicine

## 2020-03-25 DIAGNOSIS — K921 Melena: Secondary | ICD-10-CM

## 2020-03-25 DIAGNOSIS — R110 Nausea: Secondary | ICD-10-CM

## 2020-03-25 DIAGNOSIS — R197 Diarrhea, unspecified: Secondary | ICD-10-CM

## 2020-03-25 DIAGNOSIS — R1084 Generalized abdominal pain: Secondary | ICD-10-CM | POA: Diagnosis not present

## 2020-03-25 LAB — AMYLASE: Amylase: 45 U/L (ref 21–101)

## 2020-03-25 LAB — POCT CBC W AUTO DIFF (K'VILLE URGENT CARE)

## 2020-03-25 LAB — COMPLETE METABOLIC PANEL WITH GFR
AG Ratio: 1.6 (calc) (ref 1.0–2.5)
ALT: 15 U/L (ref 9–46)
AST: 16 U/L (ref 10–35)
Albumin: 4.4 g/dL (ref 3.6–5.1)
Alkaline phosphatase (APISO): 85 U/L (ref 35–144)
BUN: 19 mg/dL (ref 7–25)
CO2: 25 mmol/L (ref 20–32)
Calcium: 9.7 mg/dL (ref 8.6–10.3)
Chloride: 105 mmol/L (ref 98–110)
Creat: 1.18 mg/dL (ref 0.70–1.33)
GFR, Est African American: 81 mL/min/{1.73_m2} (ref >=60)
GFR, Est Non African American: 70 mL/min/{1.73_m2} (ref >=60)
Globulin: 2.8 g/dL (calc) (ref 1.9–3.7)
Glucose, Bld: 104 mg/dL — ABNORMAL HIGH (ref 65–99)
Potassium: 4.3 mmol/L (ref 3.5–5.3)
Sodium: 139 mmol/L (ref 135–146)
Total Bilirubin: 0.5 mg/dL (ref 0.2–1.2)
Total Protein: 7.2 g/dL (ref 6.1–8.1)

## 2020-03-25 LAB — POCT URINALYSIS DIP (MANUAL ENTRY)
Bilirubin, UA: NEGATIVE
Blood, UA: NEGATIVE
Glucose, UA: NEGATIVE mg/dL
Ketones, POC UA: NEGATIVE mg/dL
Leukocytes, UA: NEGATIVE
Nitrite, UA: NEGATIVE
Protein Ur, POC: NEGATIVE mg/dL
Spec Grav, UA: 1.025 (ref 1.010–1.025)
Urobilinogen, UA: 0.2 E.U./dL
pH, UA: 5.5 (ref 5.0–8.0)

## 2020-03-25 LAB — SEDIMENTATION RATE: Sed Rate: 6 mm/h (ref 0–20)

## 2020-03-25 LAB — LIPASE: Lipase: 27 U/L (ref 7–60)

## 2020-03-25 MED ORDER — ONDANSETRON 4 MG PO TBDP
4.0000 mg | ORAL_TABLET | Freq: Once | ORAL | Status: AC
  Administered 2020-03-25: 4 mg via ORAL

## 2020-03-25 MED ORDER — AMOXICILLIN-POT CLAVULANATE 875-125 MG PO TABS: ORAL_TABLET | ORAL | 0 refills | Status: AC

## 2020-03-25 NOTE — ED Triage Notes (Addendum)
Vomited x 1,nausea fever, chills, diarrhea started last night after eating ice cream cake His fiance is being treated for C-Diff.  Abdominal cramping continues Was treated for colitis in May

## 2020-03-25 NOTE — Discharge Instructions (Addendum)
Begin Pedialyte for about 12 to 18 hours until diarrhea stops, then switch to clear liquids (apple juice, clear grape juice, Jello, etc) for about 12 to 18 hours.  When improved, advance to a bland diet (Bananas, Applesauce, Toast). Then gradually resume a regular diet when tolerated.  Avoid milk products.    If symptoms become significantly worse during the night or over the weekend, proceed to the local emergency room.

## 2020-03-25 NOTE — ED Provider Notes (Signed)
[facility]    CSN: [id number] Arrival date & time: 03/25/20  0816      History   Chief Complaint Chief Complaint  Patient presents with  . Emesis    HPI Joshua Cantrell is a 54 y.o. male.   Patient complains of vague diffuse abdominal pain for one week.  At 7:30pm yesterday he ate ice cream cake resulting in increased pain in his lower abdomen radiating to his back.  He also developed chills/sweats, nausea with one episode vomiting, and diarrhea.  He has noted some blood in his diarrhea.  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 On 12/26/19 he developed abdominal pain that was evaluated in a local hospital ED.  CT scan revealed diverticulitis and he was effectively treated with antibiotics. His fiance is being treated for C-diff colitis.      The history is provided by the patient.  Abdominal Pain Pain location:  Generalized Pain quality: bloating and cramping   Pain radiates to:  Back Pain severity:  Moderate Onset quality:  Gradual Duration:  1 week Timing:  Constant Progression:  Worsening Chronicity:  Recurrent Context: awakening from sleep, diet changes and eating   Context: not recent illness, not recent travel, not sick contacts and not suspicious food intake   Relieved by:  None tried Worsened by:  Palpation and eating Ineffective treatments:  None tried Associated symptoms: anorexia, chills, diarrhea, fatigue, hematochezia, nausea and vomiting   Associated symptoms: no chest pain, no constipation, no cough, no dysuria, no fever, no flatus, no hematemesis, no hematuria, no melena, no shortness of breath and no sore throat     Past Medical History:  Diagnosis Date  . Hyperlipidemia   . Hypertension   . Ulcer of abdomen wall [facility])     Patient Active Problem List   Diagnosis Date Noted  . Depression 08/06/2011    Class: Diagnosis of  . BACK PAIN, THORACIC REGION, RIGHT  02/06/2010  . CERVICAL MUSCLE STRAIN 02/06/2010  . HYPERLIPIDEMIA 01/24/2010  . CELLULITIS, LEG, LEFT 01/24/2010  . LEG PAIN, LEFT 01/24/2010    Past Surgical History:  Procedure Laterality Date  . BLADDER SURGERY    . TIBIA FRACTURE SURGERY         Home Medications    Prior to Admission medications   Medication Sig Start Date End Date Taking? Authorizing Provider  amoxicillin-clavulanate (AUGMENTIN) 875-125 MG tablet Take one tab PO Q8hr 03/25/20   Lattie Haw, MD  atorvastatin (LIPITOR) 20 MG tablet  04/01/19   [provider]    Family History Family History  Adopted: Yes  Problem Relation Age of Onset  . Hyperlipidemia Mother   . Hypertension Mother   . Hyperlipidemia Father   . Hypertension Father     Social History Social History   Tobacco Use  . Smoking status: Former Smoker    Packs/day: 0.25    Years: 2.00    Pack years: 0.50    Types: Cigarettes  . Smokeless tobacco: Never Used  Vaping Use  . Vaping Use: Never used  Substance Use Topics  . Alcohol use: Yes    Comment: occasionally  . Drug use: No     Allergies   Morphine and Paxil [paroxetine]   Review of Systems Review of Systems  Constitutional: Positive for activity change, appetite change, chills, diaphoresis and fatigue. Negative for fever and unexpected weight change.  HENT: Negative for sore  throat.   Respiratory: Negative for cough and shortness of breath.   Cardiovascular: Negative for chest pain.  Gastrointestinal: Positive for abdominal pain, anorexia, blood in stool, diarrhea, hematochezia, nausea and vomiting. Negative for abdominal distention, constipation, flatus, hematemesis and melena.  Genitourinary: Negative for dysuria and hematuria.  All other systems reviewed and are negative.    Physical Exam Triage Vital Signs ED Triage Vitals  Enc Vitals Group     BP 03/25/20 0833 (!) 138/91     Pulse Rate 03/25/20 0833 74     Resp --      Temp 03/25/20 0833 98.1  F (36.7 C)     Temp Source 03/25/20 0833 Oral     SpO2 03/25/20 0833 98 %     Weight 03/25/20 0834 157 lb (71.2 kg)     Height 03/25/20 0834 5\' 9"  (1.753 m)     Head Circumference --      Peak Flow --      Pain Score 03/25/20 0834 8     Pain Loc --      Pain Edu? --      Excl. in [place]? --    No data found.  Updated Vital Signs BP (!) 138/91 (BP Location: Right Arm)   Pulse 74   Temp 98.1 F (36.7 C) (Oral)   Ht 5\' 9"  (1.753 m)   Wt 71.2 kg   SpO2 98%   BMI 23.18 kg/m   Visual Acuity Right Eye Distance:   Left Eye Distance:   Bilateral Distance:    Right Eye Near:   Left Eye Near:    Bilateral Near:     Physical Exam Vitals and nursing note reviewed.  Constitutional:      General: He is not in acute distress.    Appearance: He is not toxic-appearing.  HENT:     Head: Normocephalic.     Right Ear: External ear normal.     Left Ear: External ear normal.     Nose: Nose normal.     Mouth/Throat:     Mouth: Mucous membranes are moist.  Eyes:     Conjunctiva/sclera: Conjunctivae normal.     Pupils: Pupils are equal, round, and reactive to light.  Cardiovascular:     Rate and Rhythm: Normal rate and regular rhythm.     Heart sounds: Normal heart sounds.  Pulmonary:     Breath sounds: Normal breath sounds.  Abdominal:     General: Abdomen is protuberant.     Palpations: Abdomen is soft. There is no hepatomegaly or splenomegaly.     Tenderness: There is generalized abdominal tenderness. There is no right CVA tenderness or left CVA tenderness.       Comments: Diffuse abdominal tenderness to palpation.  Musculoskeletal:     Cervical back: Neck supple.     Right lower leg: No edema.     Left lower leg: No edema.  Lymphadenopathy:     Cervical: No cervical adenopathy.  Skin:    General: Skin is warm and dry.     Findings: No rash.  Neurological:     Mental Status: He is alert and oriented to person, place, and time.      UC Treatments / Results   Labs (all labs ordered are listed, but only abnormal results are displayed) Labs Reviewed  GASTROINTESTINAL PANEL BY PCR, STOOL (REPLACES STOOL CULTURE)  C DIFFICILE QUICK SCREEN W PCR REFLEX  COMPLETE METABOLIC PANEL WITH GFR  AMYLASE  LIPASE  SEDIMENTATION  RATE  POCT URINALYSIS DIP (MANUAL ENTRY) Ref Range & Units 2 d ago   Color, UA yellow yellow   Clarity, UA clear clear   Glucose, UA negative mg/dL negative   Bilirubin, UA negative negative   Ketones, POC UA negative mg/dL negative   Spec Grav, UA 1.010 - 1.025 1.025   Blood, UA negative negative   pH, UA 5.0 - 8.0 5.5   Protein Ur, POC negative mg/dL negative   Urobilinogen, UA 0.2 or 1.0 E.U./dL 0.2   Nitrite, UA Negative Negative   Leukocytes, UA Negative Negative      POCT CBC W AUTO DIFF ([facility]):  WBC 13.2; LY 23.6; MO 7.1; GR 69.3; Hgb 15.8; Platelets 306     EKG   Radiology No results found.  Procedures Procedures (including critical care time)  Medications Ordered in UC Medications  ondansetron (ZOFRAN-ODT) disintegrating tablet 4 mg (4 mg Oral Given 03/25/20 0855)    Initial Impression / Assessment and Plan / UC Course  I have reviewed the triage vital signs and the nursing notes.  Pertinent labs & imaging results that were available during my care of the patient were reviewed by me and considered in my medical decision making (see chart for details).    Note leukocytosis (WBC 13.2). Suspect diverticulitis (has documented diverticulitis by CT).  Begin Augmentin. Concern for inflammatory bowel disease with history of chronic abdominal pain and hematochezia.  Sed rate pending. Also pending amylase, lipase, CMP.  GI pathogen panel pending. Recommend follow-up with PCP in one week. Recommend evaluation by gastroenterologist.   Final Clinical Impressions(s) / UC Diagnoses   Final diagnoses:  Nausea without vomiting  Diarrhea, unspecified type  Generalized abdominal pain   Hematochezia     Discharge Instructions     Begin Pedialyte for about 12 to 18 hours until diarrhea stops, then switch to clear liquids (apple juice, clear grape juice, Jello, etc) for about 12 to 18 hours.  When improved, advance to a bland diet (Bananas, Applesauce, Toast). Then gradually resume a regular diet when tolerated.  Avoid milk products.    If symptoms become significantly worse during the night or over the weekend, proceed to the local emergency room.     ED Prescriptions    Medication Sig Dispense Auth. Provider   amoxicillin-clavulanate (AUGMENTIN) 875-125 MG tablet Take one tab PO Q8hr 21 tablet Lattie Haw, MD        Lattie Haw, MD 03/27/20 2110

## 2020-04-03 LAB — GASTROINTESTINAL PATHOGEN PANEL PCR

## 2020-04-04 LAB — C. DIFFICILE GDH AND TOXIN A/B

## 2020-11-06 ENCOUNTER — Other Ambulatory Visit: Payer: Self-pay

## 2020-11-06 ENCOUNTER — Emergency Department (INDEPENDENT_AMBULATORY_CARE_PROVIDER_SITE_OTHER)
Admission: EM | Admit: 2020-11-06 | Discharge: 2020-11-06 | Disposition: A | Discharge status: Home / Self Care | Payer: Medicare Other | Source: Home / Self Care | Attending: Family Medicine | Admitting: Family Medicine

## 2020-11-06 ENCOUNTER — Emergency Department (INDEPENDENT_AMBULATORY_CARE_PROVIDER_SITE_OTHER): Payer: Medicare Other

## 2020-11-06 DIAGNOSIS — K5900 Constipation, unspecified: Secondary | ICD-10-CM

## 2020-11-06 DIAGNOSIS — Z9049 Acquired absence of other specified parts of digestive tract: Secondary | ICD-10-CM

## 2020-11-06 DIAGNOSIS — R1084 Generalized abdominal pain: Secondary | ICD-10-CM

## 2020-11-06 LAB — POCT URINALYSIS DIP (MANUAL ENTRY)
Bilirubin, UA: NEGATIVE
Blood, UA: NEGATIVE
Glucose, UA: NEGATIVE mg/dL
Ketones, POC UA: NEGATIVE mg/dL
Leukocytes, UA: NEGATIVE
Nitrite, UA: NEGATIVE
Protein Ur, POC: NEGATIVE mg/dL
Spec Grav, UA: >=1.03 — AB (ref 1.010–1.025)
Urobilinogen, UA: 0.2 E.U./dL
pH, UA: 6 (ref 5.0–8.0)

## 2020-11-06 MED ORDER — KETOROLAC TROMETHAMINE 30 MG/ML IJ SOLN
30.0000 mg | Freq: Once | INTRAMUSCULAR | Status: AC
  Administered 2020-11-06: 30 mg via INTRAMUSCULAR

## 2020-11-06 NOTE — ED Provider Notes (Signed)
[facility]    CSN: [id number] Arrival date & time: 11/06/20  1419      History   Chief Complaint Chief Complaint  Patient presents with  . Abdominal Pain    HPI Joshua Cantrell is a 55 y.o. male.   HPI  Patient is a 55 year old gentleman here for abdominal pain.  He had a cholecystectomy on 10/17/2020.  He states that he felt pretty well for the first couple of weeks but then started to have increasing abdominal pain.  Today his pain is "severe".  I have reviewed the notes from his surgeon and his notes to his surgeon's office.  He told him that his pain was so severe today he thought he would fall over and die.  They recommended that he call 911 and go to the emergency room.  Patient chose, instead, to come here for evaluation.  He has no nausea or vomiting.  Normal bowel movements.  No fever or chills.  Crampy abdominal pain that waxes and wanes.  It is present all the time.  It is worse after he eats.  He points to his whole periumbilical region as the area of pain. Past medical history includes alcoholism.  He states that he is finished drinking. He is taking ibuprofen for pain. He has hypertension and hyperlipidemia and is compliant with his medical care Patient states that he is disabled from working because of arthritis  Past Medical History:  Diagnosis Date  . Hyperlipidemia   . Hypertension   . Ulcer of abdomen wall [facility])     Patient Active Problem List   Diagnosis Date Noted  . Depression 08/06/2011    Class: Diagnosis of  . BACK PAIN, THORACIC REGION, RIGHT 02/06/2010  . CERVICAL MUSCLE STRAIN 02/06/2010  . HYPERLIPIDEMIA 01/24/2010  . CELLULITIS, LEG, LEFT 01/24/2010  . LEG PAIN, LEFT 01/24/2010    Past Surgical History:  Procedure Laterality Date  . BLADDER SURGERY    . CHOLECYSTECTOMY    . TIBIA FRACTURE SURGERY         Home Medications    Prior to Admission medications   Medication Sig Start Date End Date Taking? Authorizing Provider   fluticasone (FLONASE) 50 MCG/ACT nasal spray Place into the nose. 05/07/20 05/07/21 Yes [provider]  ibuprofen (ADVIL) 200 MG tablet Take 200 mg by mouth every 6 (six) hours as needed.   Yes [provider]  metoprolol succinate (TOPROL-XL) 100 MG 24 hr tablet Take by mouth. 09/01/20  Yes [provider]  amoxicillin-clavulanate (AUGMENTIN) 875-125 MG tablet Take one tab PO Q8hr 03/25/20   Lattie Haw, MD  atorvastatin (LIPITOR) 20 MG tablet  04/01/19   [provider]    Family History Family History  Adopted: Yes  Problem Relation Age of Onset  . Hyperlipidemia Mother   . Hypertension Mother   . Hyperlipidemia Father   . Hypertension Father     Social History Social History   Tobacco Use  . Smoking status: Current Every Day Smoker  . Smokeless tobacco: Never Used  Vaping Use  . Vaping Use: Never used  Substance Use Topics  . Alcohol use: Yes    Comment: occasionally  . Drug use: No     Allergies   Morphine and Paxil [paroxetine]   Review of Systems Review of Systems See HPI  Physical Exam Triage Vital Signs ED Triage Vitals  Enc Vitals Group     BP 11/06/20 1435 128/83     Pulse  Rate 11/06/20 1435 80     Resp 11/06/20 1435 20     Temp 11/06/20 1435 99 F (37.2 C)     Temp Source 11/06/20 1435 Oral     SpO2 11/06/20 1435 97 %     Weight 11/06/20 1434 160 lb (72.6 kg)     Height 11/06/20 1434 5\' 9"  (1.753 m)     Head Circumference --      Peak Flow --      Pain Score 11/06/20 1433 7     Pain Loc --      Pain Edu? --      Excl. in [place]? --    No data found.  Updated Vital Signs BP 128/83 (BP Location: Left Arm)   Pulse 80   Temp 99 F (37.2 C) (Oral)   Resp 20   Ht 5\' 9"  (1.753 m)   Wt 72.6 kg   SpO2 97%   BMI 23.63 kg/m        Physical Exam Constitutional:      General: He is not in acute distress.    Appearance: He is well-developed and well-nourished.     Comments: Patient is alert.  Appears in  no acute distress although he does move with slow guarded gait and appears uncomfortable.  He is using a cane  HENT:     Head: Normocephalic and atraumatic.     Right Ear: Tympanic membrane and ear canal normal.     Left Ear: Tympanic membrane and ear canal normal.     Nose: Nose normal. No congestion.     Mouth/Throat:     Mouth: Oropharynx is clear and moist. Mucous membranes are dry.     Comments: Mouth slightly dry Eyes:     Conjunctiva/sclera: Conjunctivae normal.     Pupils: Pupils are equal, round, and reactive to light.  Cardiovascular:     Rate and Rhythm: Normal rate and regular rhythm.     Heart sounds: Normal heart sounds.  Pulmonary:     Effort: Pulmonary effort is normal. No respiratory distress.     Breath sounds: Normal breath sounds.  Abdominal:     General: There is distension.     Palpations: Abdomen is soft.     Comments: Protuberant abdomen.  Well-healed laparoscopic scars.  No erythema, induration, drainage.  Patient will allow only superficial palpation complaining of severe pain, he vocalizes and pulls away.  Guarding.  No true rebound.  Cannot appreciate organomegaly  Musculoskeletal:        General: No edema. Normal range of motion.     Cervical back: Normal range of motion.  Lymphadenopathy:     Cervical: No cervical adenopathy.  Skin:    General: Skin is warm and dry.  Neurological:     Mental Status: He is alert.      UC Treatments / Results  Labs (all labs ordered are listed, but only abnormal results are displayed) Labs Reviewed  POCT URINALYSIS DIP (MANUAL ENTRY) - Abnormal; Notable for the following components:      Result Value   Spec Grav, UA >=1.030 (*)    All other components within normal limits  Other than slight concentration the urinalysis is completely normal  EKG   Radiology DG Abdomen 1 View  Result Date: 11/06/2020 CLINICAL DATA:  Pain.  Three weeks post cholecystectomy EXAM: ABDOMEN - 1 VIEW COMPARISON:  None. FINDINGS:  There is diffuse stool throughout much of the colon. There is no bowel dilatation or  air-fluid level to suggest bowel obstruction. No free air. There are surgical clips in the right upper abdomen. Visualized lung bases are clear. Probable small phlebolith noted in the right pelvis. IMPRESSION: Diffuse stool throughout much of colon. Question a degree of constipation. No bowel obstruction or free air evident. Electronically Signed   By: Bretta Bang III M.D.   On: 11/06/2020 15:20    Procedures Procedures (including critical care time)  Medications Ordered in UC Medications  ketorolac (TORADOL) 30 MG/ML injection 30 mg (has no administration in time range)    Initial Impression / Assessment and Plan / UC Course  I have reviewed the triage vital signs and the nursing notes.  Pertinent labs & imaging results that were available during my care of the patient were reviewed by me and considered in my medical decision making (see chart for details).     I explained the patient that constipation after an operation is very common.  Between the anesthetics, then the pain medication, the inactivity, and the decreased p.o. intake this happens a lot.  He does have some dehydration apparent on his urinalysis.  X-ray shows extensive stool in colon.  We discussed treatment.  He will follow-up with his surgeon.  He is told, and it is emphasized, that he must go to the emergency room if his pain does not improve Final Clinical Impressions(s) / UC Diagnoses   Final diagnoses:  Acute constipation  Status post laparoscopic cholecystectomy  Generalized abdominal pain     Discharge Instructions     You are slightly dehydrated.  You must drink more water Eat high-fiber foods Continue ibuprofen as needed for pain Take MiraLAX twice a day until you have a bowel movement, then reduce to once a day until you see your surgeon in follow-up next week If you are worse at any time instead of better, go to the  emergency room    ED Prescriptions    None     PDMP not reviewed this encounter.   Eustace Moore, MD 11/06/20 1540

## 2020-11-06 NOTE — Discharge Instructions (Addendum)
You are slightly dehydrated.  You must drink more water Eat high-fiber foods Continue ibuprofen as needed for pain Take MiraLAX twice a day until you have a bowel movement, then reduce to once a day until you see your surgeon in follow-up next week If you are worse at any time instead of better, go to the emergency room

## 2020-11-06 NOTE — ED Triage Notes (Signed)
Pt presents to Urgent Care with c/o lower abdominal and bil side pain x 4 days. Denies n/v. Reports having cholecystectomy on 10/16/20.

## 2021-10-01 IMAGING — DX DG ABDOMEN 1V
2 series · 2 of 2 positions shown · non-contrast
Comparison: None.

CLINICAL DATA: Pain.  Three weeks post cholecystectomy

EXAM:
ABDOMEN - 1 VIEW

[abdomen kub (1 of 2)]
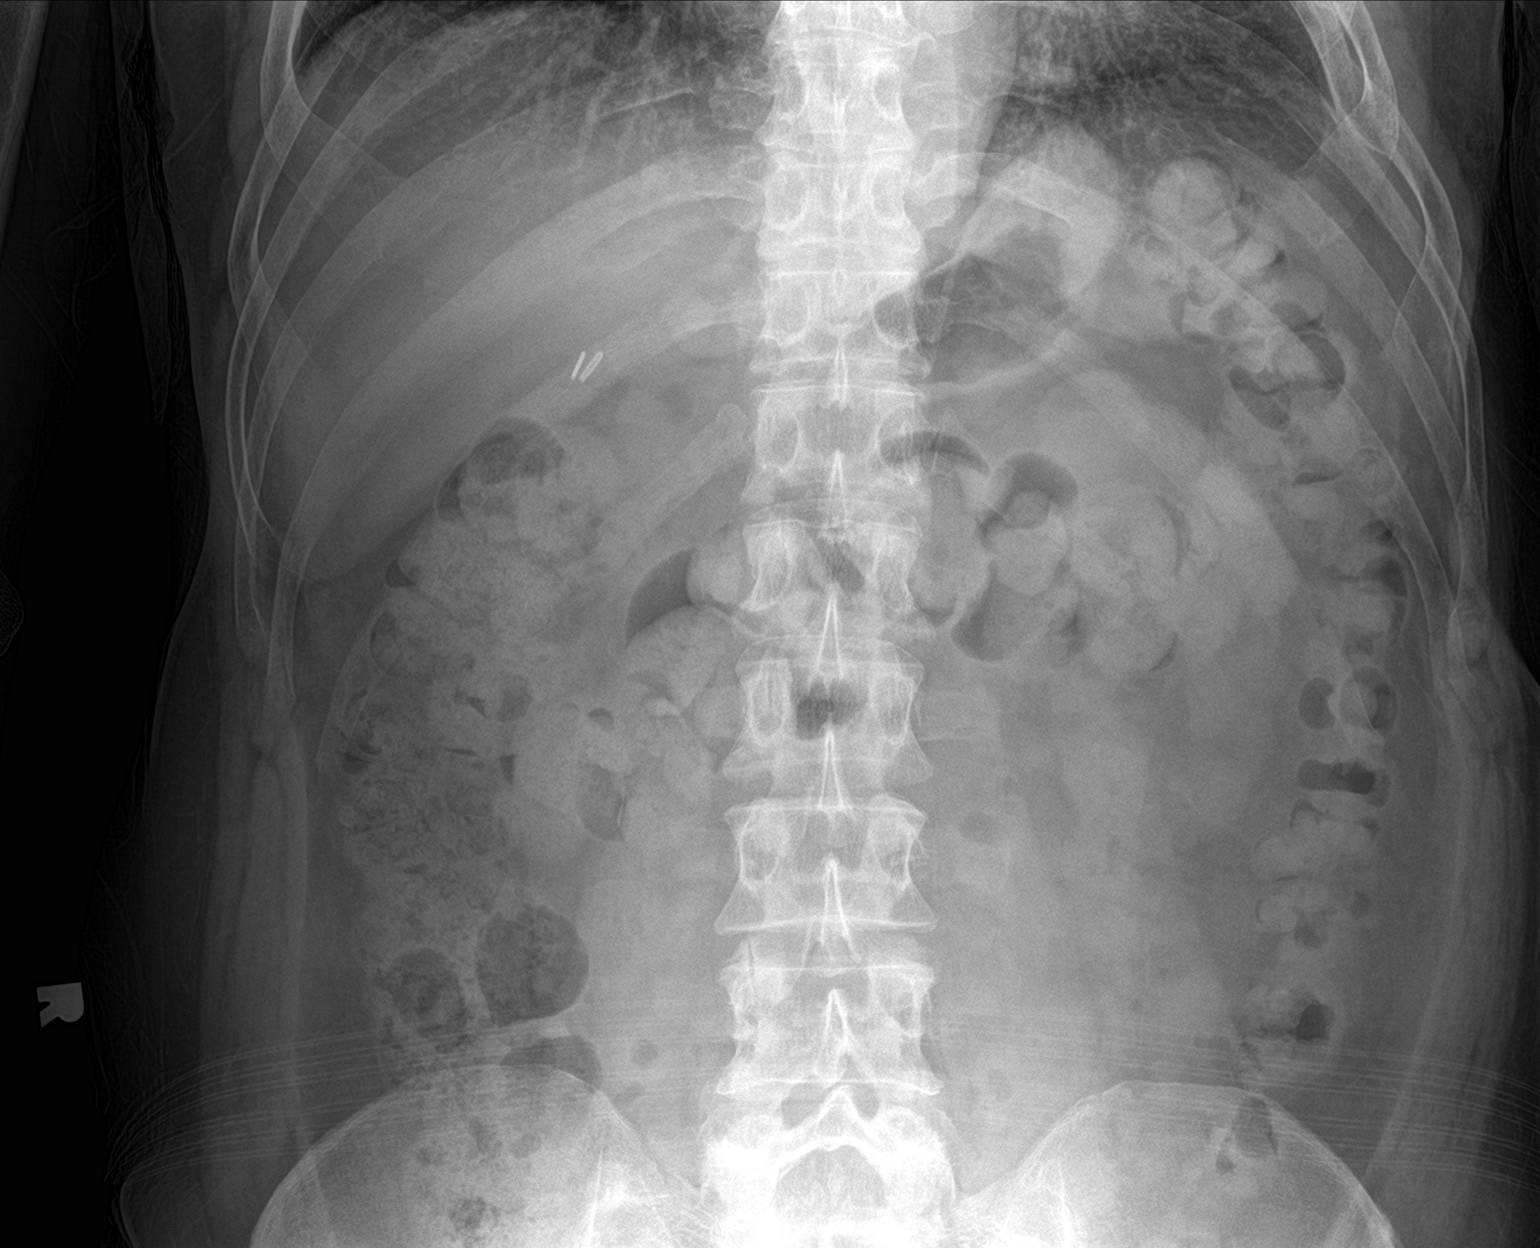

[abdomen kub (2 of 2)]
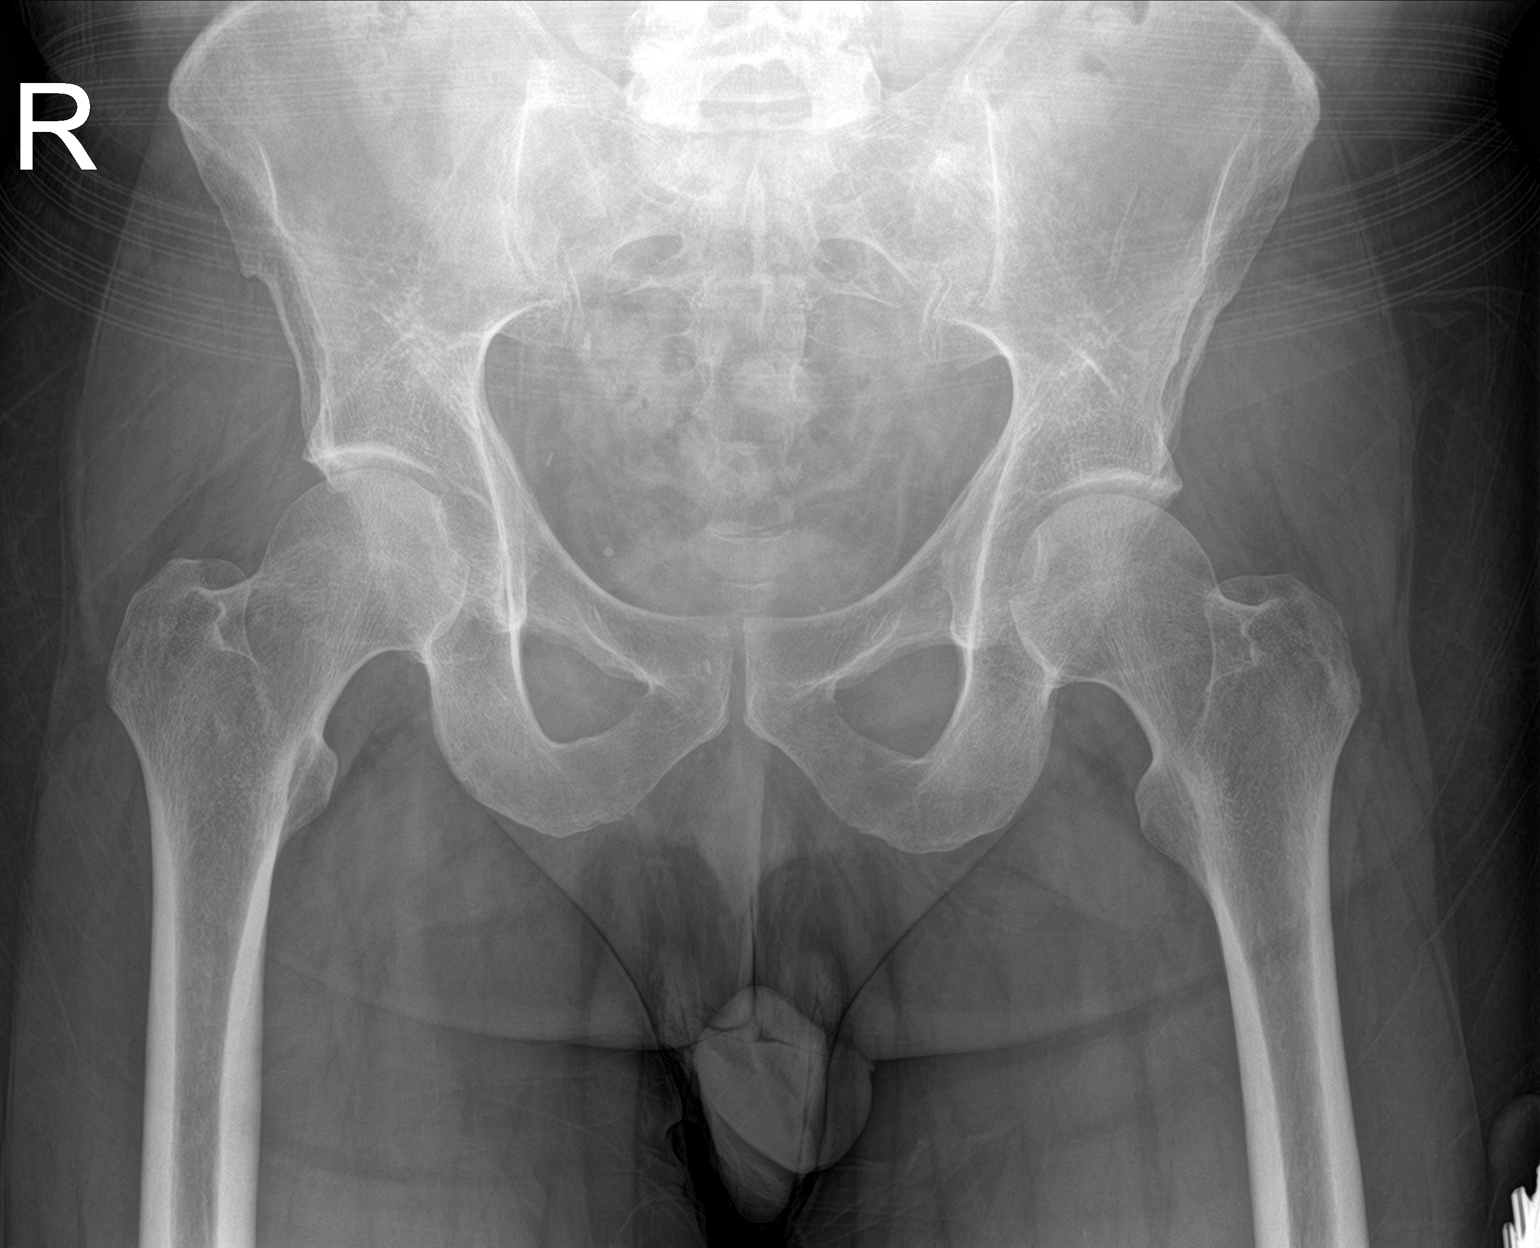

[2 of 2 positions shown; findings below may reference images not displayed]

FINDINGS: There is diffuse stool throughout much of the colon. There is no
bowel dilatation or air-fluid level to suggest bowel obstruction. No
free air. There are surgical clips in the right upper abdomen.
Visualized lung bases are clear. Probable small phlebolith noted in
the right pelvis.
IMPRESSION: Diffuse stool throughout much of colon. Question a degree of
constipation. No bowel obstruction or free air evident.

## 2021-10-11 ENCOUNTER — Other Ambulatory Visit: Payer: Self-pay

## 2021-10-11 ENCOUNTER — Emergency Department (INDEPENDENT_AMBULATORY_CARE_PROVIDER_SITE_OTHER)
Admission: EM | Admit: 2021-10-11 | Discharge: 2021-10-11 | Disposition: A | Discharge status: Home / Self Care | Payer: Medicare (Managed Care) | Source: Home / Self Care

## 2021-10-11 DIAGNOSIS — U071 COVID-19: Secondary | ICD-10-CM | POA: Diagnosis not present

## 2021-10-11 LAB — POC SARS CORONAVIRUS 2 AG -  ED: SARS Coronavirus 2 Ag: POSITIVE — AB

## 2021-10-11 LAB — POCT INFLUENZA A/B
Influenza A, POC: NEGATIVE
Influenza B, POC: NEGATIVE

## 2021-10-11 MED ORDER — NIRMATRELVIR/RITONAVIR (PAXLOVID)TABLET: 3.0000 | ORAL_TABLET | Freq: Two times a day (BID) | ORAL | 0 refills | Status: AC

## 2021-10-11 NOTE — ED Triage Notes (Signed)
Pt states that he has some fatigue, body aches, cough and nasal congestion. X3 days  Pt states that he was exposed to covid.  Pt states that he isn't vaccinated for covid.  Pt states that he hasn't flu vaccine.

## 2021-10-12 NOTE — ED Provider Notes (Signed)
[name]    CSN: [id number] Arrival date & time: 10/11/21  1320      History   Chief Complaint Chief Complaint  Patient presents with   Fatigue    Fatigue, body aches, cough and nasal congestion. X3 days    HPI Joshua Cantrell is a 56 y.o. male.   Pt reports he was exposed to covid   The history is provided by the patient. No language interpreter was used.  Cough Cough characteristics:  Non-productive Sputum characteristics:  Nondescript Severity:  Moderate Onset quality:  Gradual Duration:  3 days Timing:  Constant Progression:  Worsening Relieved by:  Nothing Worsened by:  Nothing Ineffective treatments:  None tried Associated symptoms: no shortness of breath    Past Medical History:  Diagnosis Date   Hyperlipidemia    Hypertension    Ulcer of abdomen wall [facility])     Patient Active Problem List   Diagnosis Date Noted   Depression 08/06/2011    Class: Diagnosis of   BACK PAIN, THORACIC REGION, RIGHT 02/06/2010   CERVICAL MUSCLE STRAIN 02/06/2010   HYPERLIPIDEMIA 01/24/2010   CELLULITIS, LEG, LEFT 01/24/2010   LEG PAIN, LEFT 01/24/2010    Past Surgical History:  Procedure Laterality Date   BLADDER SURGERY     CHOLECYSTECTOMY     TIBIA FRACTURE SURGERY         Home Medications    Prior to Admission medications   Medication Sig Start Date End Date Taking? Authorizing Provider  atorvastatin (LIPITOR) 20 MG tablet  04/01/19  Yes [provider]  cetirizine (ZYRTEC) 5 MG tablet Take by mouth.   Yes [provider]  metoprolol succinate (TOPROL-XL) 100 MG 24 hr tablet Take by mouth. 09/01/20  Yes [provider]  nirmatrelvir/ritonavir EUA (PAXLOVID) 20 x 150 MG & 10 x 100MG  TABS Take 3 tablets by mouth 2 (two) times daily for 5 days. Patient GFR is normal Take nirmatrelvir (150 mg) two tablets twice daily for 5 days and ritonavir (100 mg) one tablet twice daily for 5 days. 10/11/21 10/16/21 Yes Fransico Meadow,  PA-C  amoxicillin-clavulanate (AUGMENTIN) 875-125 MG tablet Take one tab PO Q8hr 03/25/20   Kandra Nicolas, MD  ibuprofen (ADVIL) 200 MG tablet Take 200 mg by mouth every 6 (six) hours as needed.    [provider]    Family History Family History  Adopted: Yes  Problem Relation Age of Onset   Hyperlipidemia Mother    Hypertension Mother    Hyperlipidemia Father    Hypertension Father     Social History Social History   Tobacco Use   Smoking status: Every Day   Smokeless tobacco: Never  Vaping Use   Vaping Use: Never used  Substance Use Topics   Alcohol use: Not Currently    Comment: occasionally   Drug use: No     Allergies   Morphine and Paxil [paroxetine]   Review of Systems Review of Systems  Respiratory:  Positive for cough. Negative for shortness of breath.   All other systems reviewed and are negative.   Physical Exam Triage Vital Signs ED Triage Vitals  Enc Vitals Group     BP 10/11/21 1355 136/90     Pulse Rate 10/11/21 1355 95     Resp 10/11/21 1355 18     Temp 10/11/21 1355 99.1 F (37.3 C)     Temp Source 10/11/21 1355 Oral     SpO2 10/11/21 1355 96 %  Weight 10/11/21 1353 172 lb (78 kg)     Height 10/11/21 1353 5\' 9"  (1.753 m)     Head Circumference --      Peak Flow --      Pain Score 10/11/21 1352 9     Pain Loc --      Pain Edu? --      Excl. in [place]? --    No data found.  Updated Vital Signs BP 136/90 (BP Location: Left Arm)    Pulse 95    Temp 99.1 F (37.3 C) (Oral)    Resp 18    Ht 5\' 9"  (1.753 m)    Wt 78 kg    SpO2 96%    BMI 25.40 kg/m   Visual Acuity Right Eye Distance:   Left Eye Distance:   Bilateral Distance:    Right Eye Near:   Left Eye Near:    Bilateral Near:     Physical Exam Vitals and nursing note reviewed.  Constitutional:      Appearance: He is well-developed.  HENT:     Head: Normocephalic.  Eyes:     Extraocular Movements: Extraocular movements intact.     Pupils: Pupils are equal,  round, and reactive to light.  Cardiovascular:     Rate and Rhythm: Normal rate.  Pulmonary:     Effort: Pulmonary effort is normal.  Abdominal:     General: There is no distension.  Musculoskeletal:        General: Normal range of motion.     Cervical back: Normal range of motion.  Skin:    General: Skin is warm.  Neurological:     General: No focal deficit present.     Mental Status: He is alert and oriented to person, place, and time.  Psychiatric:        Mood and Affect: Mood normal.     UC Treatments / Results  Labs (all labs ordered are listed, but only abnormal results are displayed) Labs Reviewed  POC SARS CORONAVIRUS 2 AG -  ED - Abnormal; Notable for the following components:      Result Value   SARS Coronavirus 2 Ag Positive (*)    All other components within normal limits  POCT INFLUENZA A/B    EKG   Radiology No results found.  Procedures Procedures (including critical care time)  Medications Ordered in UC Medications - No data to display  Initial Impression / Assessment and Plan / UC Course  I have reviewed the triage vital signs and the nursing notes.  Pertinent labs & imaging results that were available during my care of the patient were reviewed by me and considered in my medical decision making (see chart for details).     MDM  covid is positive  Pt given rx for paxlovid  Final Clinical Impressions(s) / UC Diagnoses   Final diagnoses:  COVID   Discharge Instructions   None    ED Prescriptions     Medication Sig Dispense Auth. Provider   nirmatrelvir/ritonavir EUA (PAXLOVID) 20 x 150 MG & 10 x 100MG  TABS Take 3 tablets by mouth 2 (two) times daily for 5 days. Patient GFR is normal Take nirmatrelvir (150 mg) two tablets twice daily for 5 days and ritonavir (100 mg) one tablet twice daily for 5 days. 30 tablet Fransico Meadow, [place]      PDMP not reviewed this encounter. An After Visit Summary was printed and given to the patient.  Fransico Meadow, [place] 10/12/21 1756
